# Patient Record
Sex: Female | Born: 1995 | Race: Black or African American | Hispanic: No | Marital: Single | Smoking: Never smoker
Health system: Southern US, Community
[De-identification: ages and names within clinical notes are randomized; demographics above are authoritative.]

---
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
## 2016-03-15 IMAGING — US US TRANSVAGINAL NON-OB
1 series · 14 of 25 positions shown · non-contrast
Comparison: None

CLINICAL DATA: Vaginal pain

EXAM:
TRANSABDOMINAL AND TRANSVAGINAL ULTRASOUND OF PELVIS
TECHNIQUE: Study was performed transabdominally to optimize pelvic field of
view evaluation and transvaginally to optimize internal visceral
architecture evaluation.

[Series 1: us transvaginal non-ob · 0.20mm/px · 71 acquisitions, 14 frames shown]
[im 1/71]
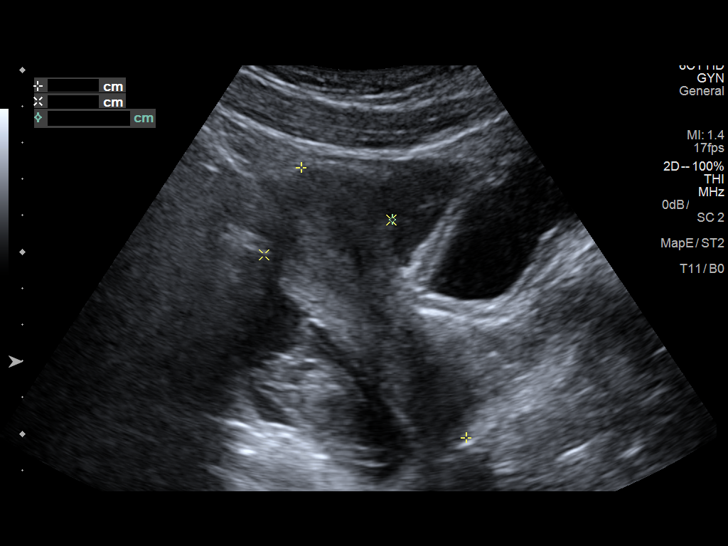
[im 6/71]
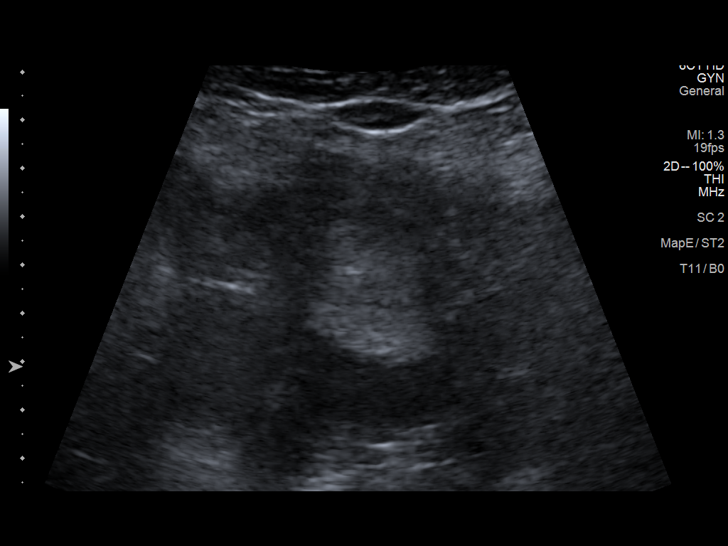
[im 12/71]
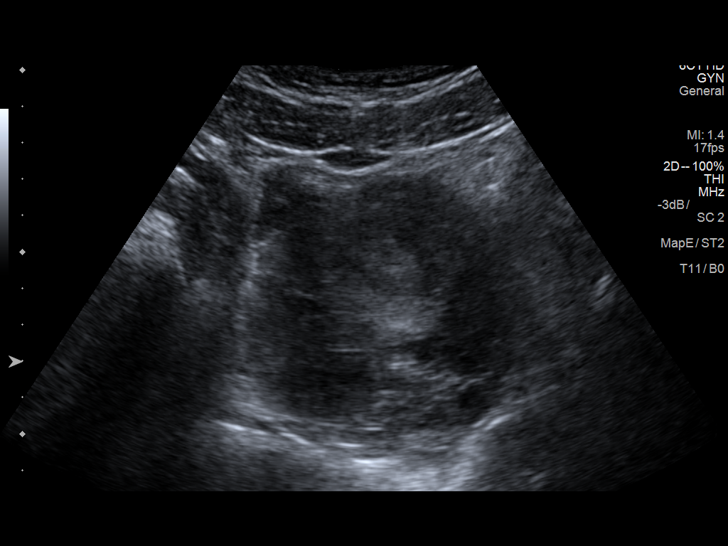
[im 18/71]
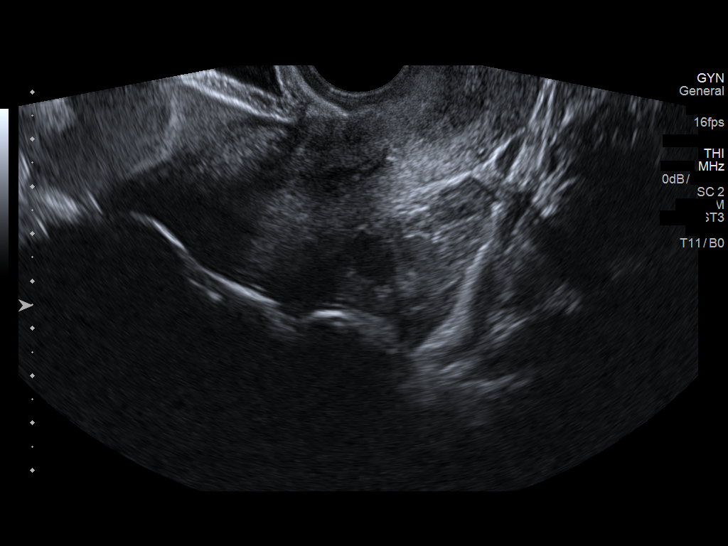
[im 24/71]
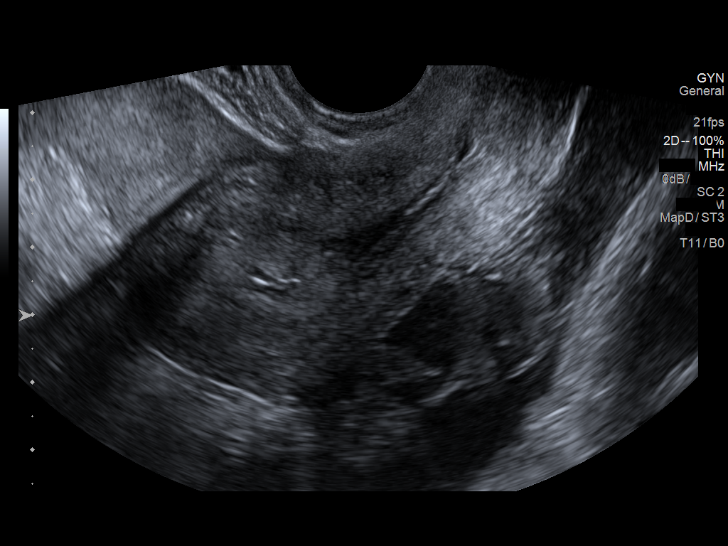
[im 27/71]
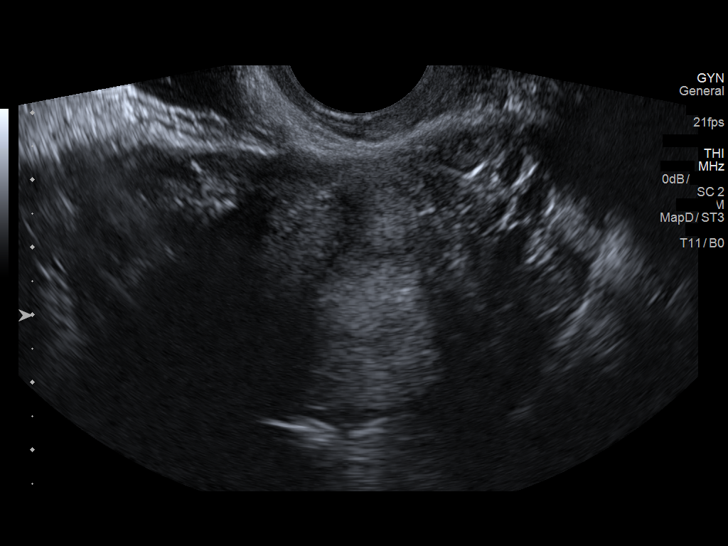
[im 33/71]
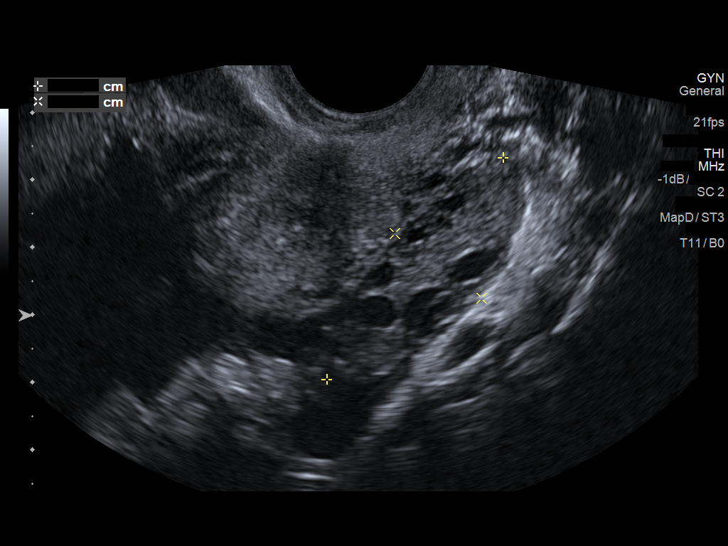
[im 38/71]
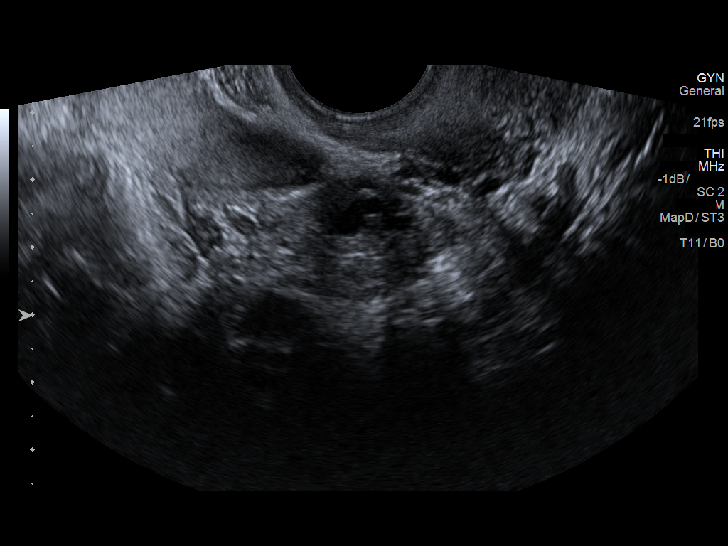
[im 44/71]
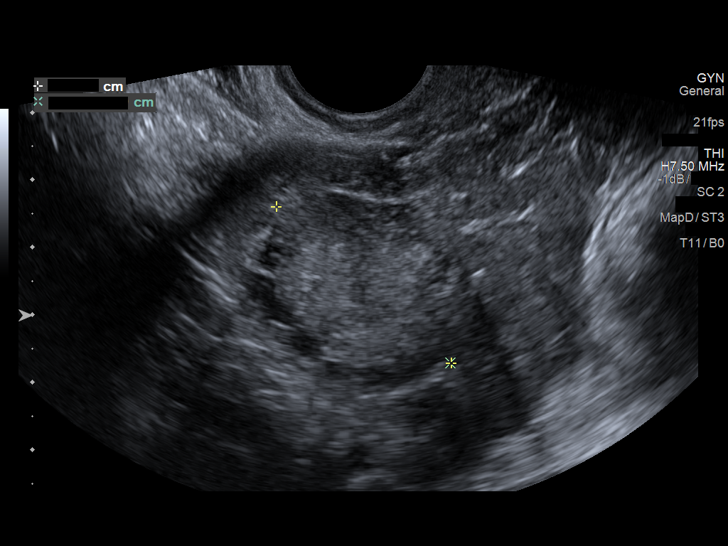
[im 47/71]
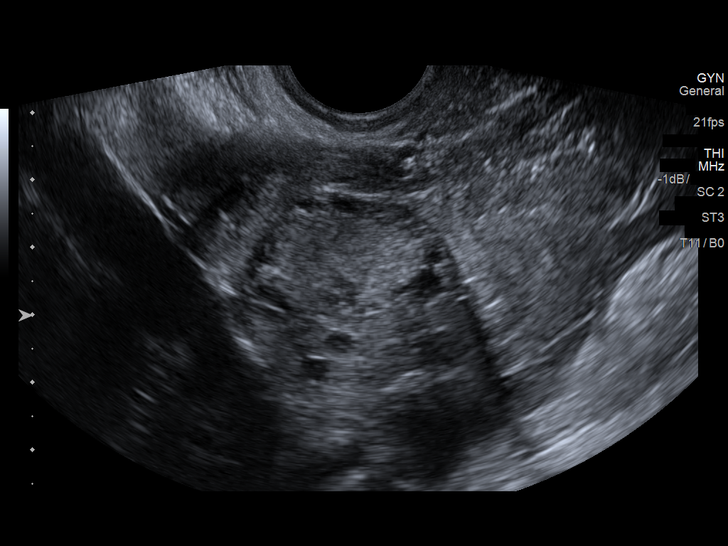
[im 53/71]
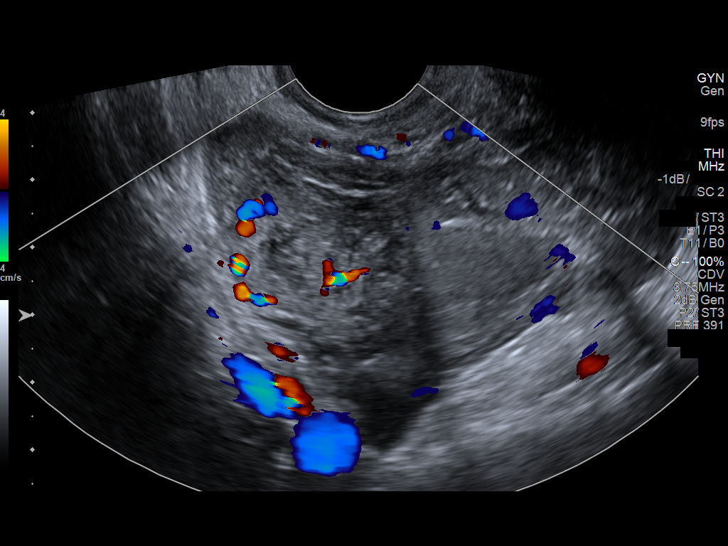
[im 59/71]
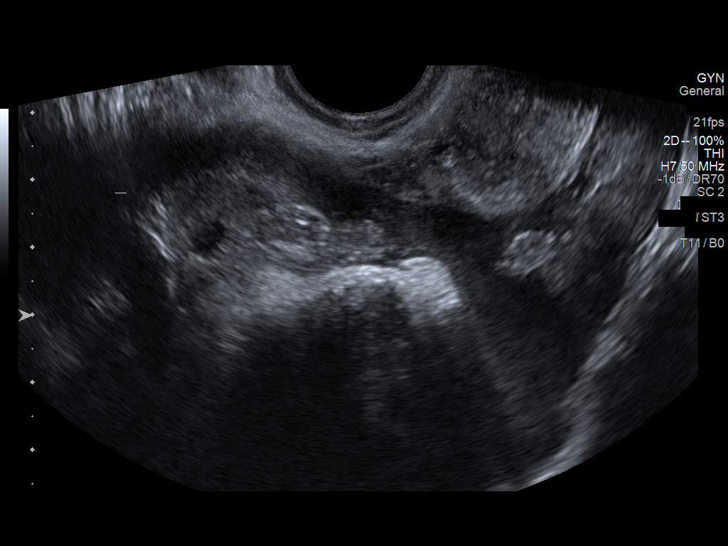
[im 65/71]
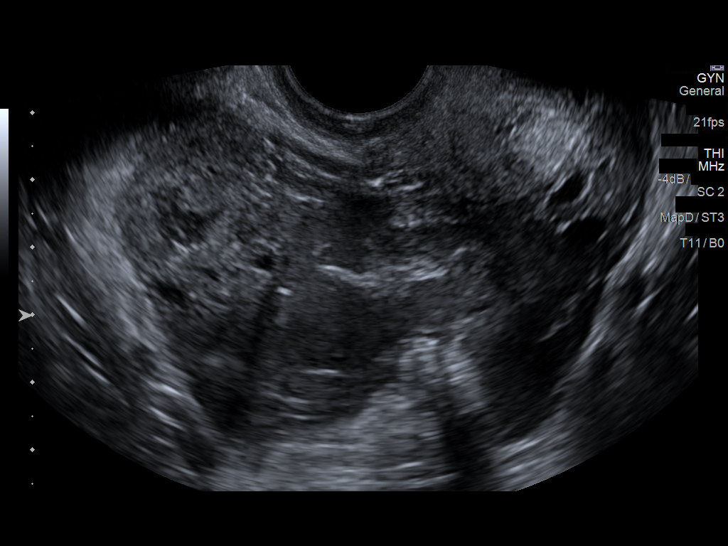
[im 71/71]
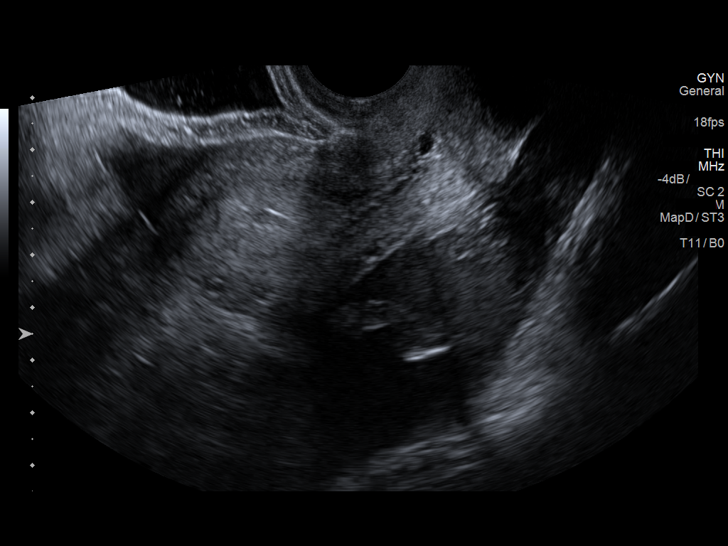

[14 of 25 positions shown; findings below may reference images not displayed]

FINDINGS: Uterus

Measurements: 8.5 x 3.9 x 4.4 cm. No fibroids or other mass
visualized.

Endometrium

Thickness: 15 mm.  No focal abnormality visualized.

Right ovary

Measurements: 3.0 x 3.6 x 3.5 cm. Normal appearance/no adnexal mass.

Left ovary

Measurements: 4.2 x 0.6 x 3 5 cm. Normal appearance/no adnexal mass.

Other findings

There is fairly extensive complex fluid surrounding the right ovary
and extending to the cul-de-sac.
IMPRESSION: Complex fluid surrounding the right ovary and extending into the
cul-de-sac. Suspect hemorrhage, although infected fluid is a
differential consideration. Is fluid could be explained by recent
ovarian cyst rupture. Currently there is no intrauterine or
extrauterine pelvic mass.

## 2016-12-28 IMAGING — CR DG ABDOMEN 1V
1 series · 1 of 1 positions shown · non-contrast
Comparison: None.

CLINICAL DATA: Lower abdominal pain with nausea

EXAM:
ABDOMEN - 1 VIEW

[abdomen kub]
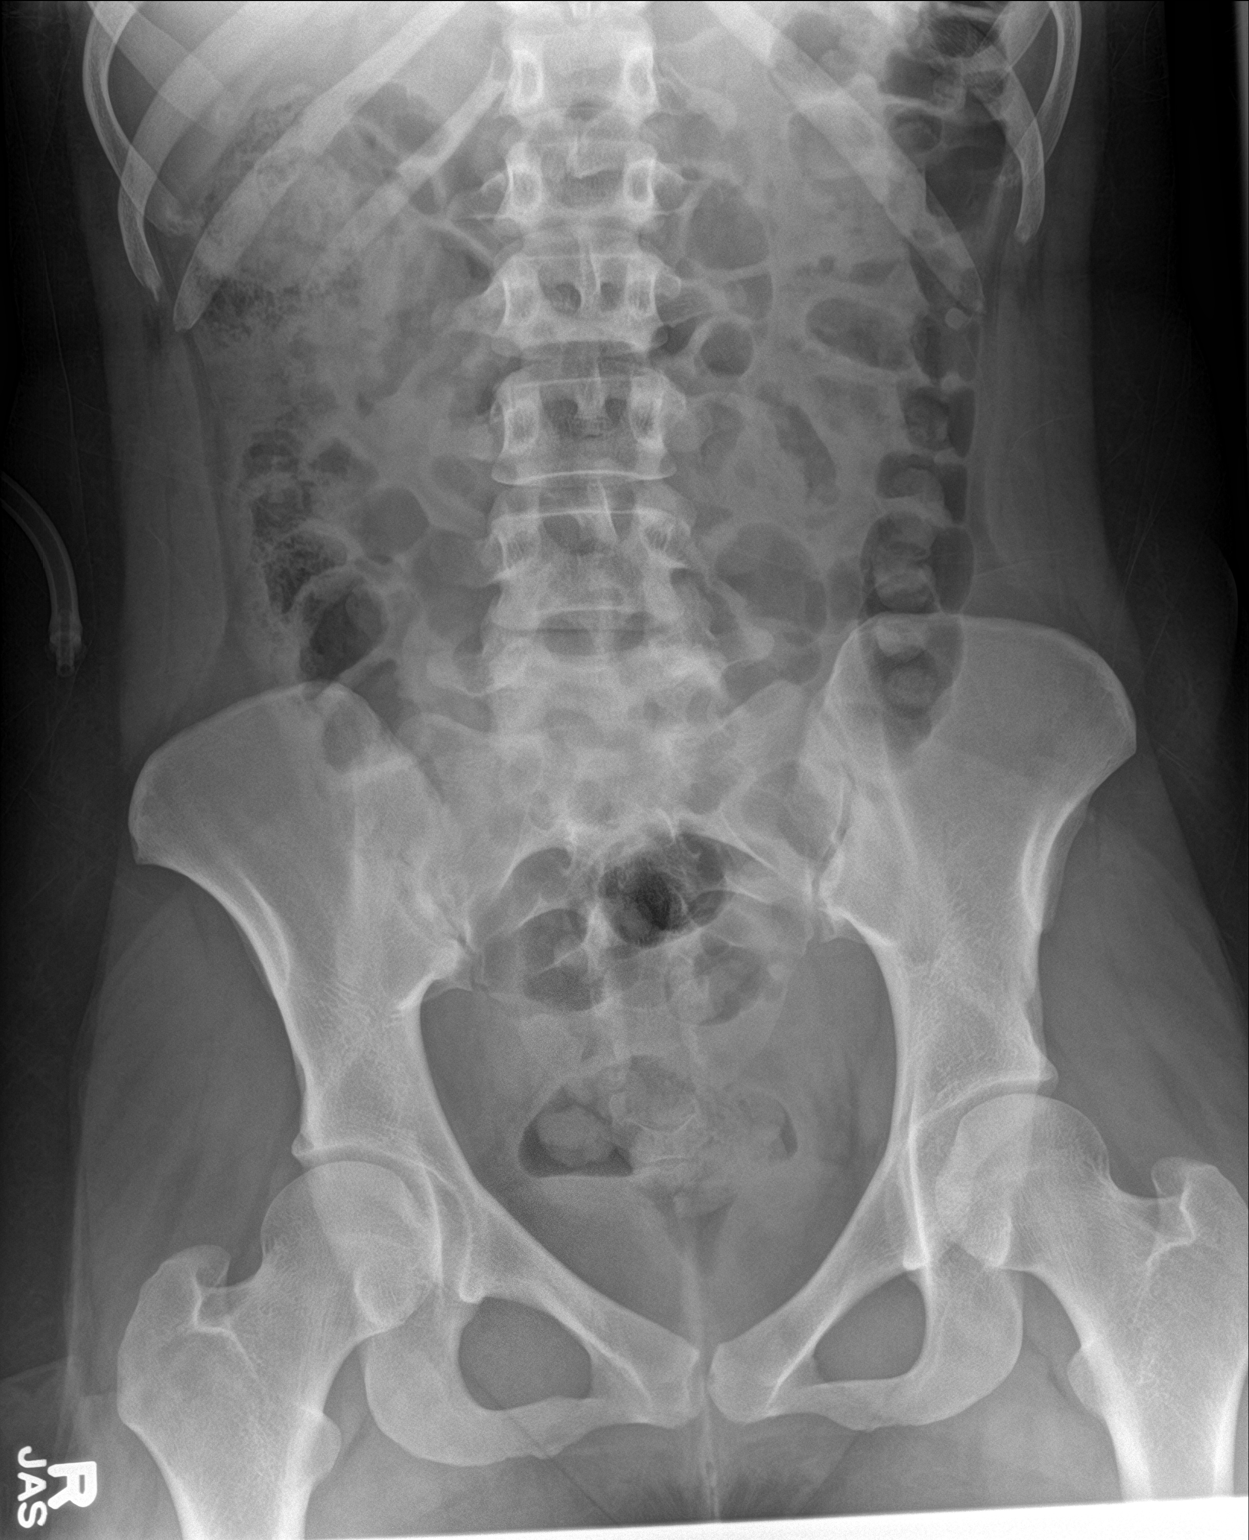

[1 of 1 positions shown; findings below may reference images not displayed]

FINDINGS: There is moderate stool in the colon. There is no bowel dilatation
or air-fluid level suggesting obstruction. No free air. No abnormal
calcifications.
IMPRESSION: Bowel gas pattern unremarkable without obstruction or free air
apparent. Moderate stool in colon.
